# Patient Record
Sex: Male | Born: 1970 | Race: Black or African American | Hispanic: No | Marital: Single
Health system: Southern US, Community
[De-identification: ages and names within clinical notes are randomized; demographics above are authoritative.]

---

## 2006-10-29 ENCOUNTER — Emergency Department (HOSPITAL_COMMUNITY): Admission: EM | Admit: 2006-10-29 | Discharge: 2006-10-29 | Payer: Self-pay | Admitting: Emergency Medicine

## 2007-05-31 ENCOUNTER — Emergency Department: Payer: Self-pay | Admitting: Emergency Medicine

## 2007-05-31 ENCOUNTER — Other Ambulatory Visit: Payer: Self-pay

## 2010-09-28 ENCOUNTER — Emergency Department: Payer: Self-pay | Admitting: Emergency Medicine

## 2011-02-18 ENCOUNTER — Ambulatory Visit: Payer: Self-pay | Admitting: Emergency Medicine

## 2011-03-11 ENCOUNTER — Ambulatory Visit: Payer: Self-pay | Admitting: Emergency Medicine

## 2011-03-15 LAB — PATHOLOGY REPORT

## 2011-03-23 ENCOUNTER — Emergency Department: Payer: Self-pay | Admitting: Emergency Medicine

## 2011-09-16 ENCOUNTER — Emergency Department: Payer: Self-pay | Admitting: Emergency Medicine

## 2011-12-02 ENCOUNTER — Encounter: Payer: Self-pay | Admitting: Internal Medicine

## 2011-12-03 ENCOUNTER — Encounter: Payer: Self-pay | Admitting: Internal Medicine

## 2011-12-09 ENCOUNTER — Ambulatory Visit: Payer: Self-pay | Admitting: Emergency Medicine

## 2011-12-17 ENCOUNTER — Ambulatory Visit: Payer: Self-pay | Admitting: Specialist

## 2011-12-31 ENCOUNTER — Encounter: Payer: Self-pay | Admitting: Internal Medicine

## 2012-01-31 ENCOUNTER — Encounter: Payer: Self-pay | Admitting: Internal Medicine

## 2012-04-05 ENCOUNTER — Inpatient Hospital Stay: Payer: Self-pay | Admitting: Internal Medicine

## 2012-04-05 LAB — COMPREHENSIVE METABOLIC PANEL
Albumin: 4.3 g/dL (ref 3.4–5.0)
BUN: 17 mg/dL (ref 7–18)
Bilirubin,Total: 0.7 mg/dL (ref 0.2–1.0)
Calcium, Total: 9 mg/dL (ref 8.5–10.1)
Chloride: 102 mmol/L (ref 98–107)
Co2: 25 mmol/L (ref 21–32)
Creatinine: 1.56 mg/dL — ABNORMAL HIGH (ref 0.60–1.30)
EGFR (African American): >60
Potassium: 2.7 mmol/L — ABNORMAL LOW (ref 3.5–5.1)
SGOT(AST): 37 U/L (ref 15–37)
Total Protein: 9.1 g/dL — ABNORMAL HIGH (ref 6.4–8.2)

## 2012-04-05 LAB — ACETAMINOPHEN LEVEL: Acetaminophen: <2 ug/mL

## 2012-04-05 LAB — VALPROIC ACID LEVEL
Valproic Acid: 131 ug/mL — ABNORMAL HIGH
Valproic Acid: 142 ug/mL — ABNORMAL HIGH

## 2012-04-05 LAB — DRUG SCREEN, URINE
Amphetamines, Ur Screen: NEGATIVE (ref ?–1000)
Barbiturates, Ur Screen: NEGATIVE (ref ?–200)
Tricyclic, Ur Screen: NEGATIVE (ref ?–1000)

## 2012-04-05 LAB — CBC
HGB: 14.6 g/dL (ref 13.0–18.0)
MCH: 29.1 pg (ref 26.0–34.0)
Platelet: 216 10*3/uL (ref 150–440)
RDW: 14.1 % (ref 11.5–14.5)

## 2012-04-05 LAB — MAGNESIUM: Magnesium: 2.3 mg/dL

## 2012-04-06 ENCOUNTER — Inpatient Hospital Stay: Payer: Self-pay | Admitting: Unknown Physician Specialty

## 2012-04-06 LAB — BASIC METABOLIC PANEL
BUN: 12 mg/dL (ref 7–18)
Chloride: 108 mmol/L — ABNORMAL HIGH (ref 98–107)
Co2: 24 mmol/L (ref 21–32)
Creatinine: 1.25 mg/dL (ref 0.60–1.30)
EGFR (Non-African Amer.): >60

## 2012-04-06 LAB — VALPROIC ACID LEVEL: Valproic Acid: 103 ug/mL — ABNORMAL HIGH

## 2012-04-07 LAB — VALPROIC ACID LEVEL: Valproic Acid: 29 ug/mL — ABNORMAL LOW

## 2012-04-10 LAB — LITHIUM LEVEL: Lithium: 0.32 mmol/L — ABNORMAL LOW

## 2012-04-10 LAB — TSH: Thyroid Stimulating Horm: 0.775 u[IU]/mL

## 2012-04-10 LAB — LIPID PANEL: Cholesterol: 148 mg/dL (ref 0–200)

## 2012-04-17 ENCOUNTER — Emergency Department: Payer: Self-pay | Admitting: Emergency Medicine

## 2012-04-17 LAB — URINALYSIS, COMPLETE
Bilirubin,UR: NEGATIVE
Blood: NEGATIVE
Ketone: NEGATIVE
Leukocyte Esterase: NEGATIVE
Protein: NEGATIVE
Specific Gravity: 1.012 (ref 1.003–1.030)

## 2015-02-23 NOTE — Discharge Summary (Signed)
PATIENT NAME:  Stephen Phillips, Stephen Phillips#:  [id number] DATE OF BIRTH:  07/20/1971  DATE OF ADMISSION:  04/05/2012 DATE OF DISCHARGE:  04/06/2012  PRIMARY CARE PHYSICIAN: Bluford MainSheikh Tejan-Sie, MD   DISPOSITION: The patient is to be discharged to [facility].   DISCHARGE DIAGNOSES:  1. Suicide attempt with overdose on Depakote/valproic acid.  2. Hypertension. 3. Gastroesophageal reflux disease.  4. Hepatitis C. 5. Chronic obstructive pulmonary disease.   DISCHARGE MEDICATIONS: The patient will continue to use his Proventil inhaler as needed. Additionally, he will take omeprazole 20 mg, 1 tab by mouth every morning 30 to 60 minutes prior to eating. The patient's chlorthalidone is currently being held due to his blood pressures at this time. His potassium supplementation is also being held due to him being off the chlorthalidone at this time and his potassium being stable without supplementation. He will continue to be assessed regarding the need for these medications [facility], and they will be reinstituted if needed.   HOSPITAL COURSE: This patient is a 44 year old African American male who presented [facility] the [facility] Emergency Room on the date of admission after having overdosed on his Depakote in a suicide attempt after marital discord. Please see History and Physical examination for full details regarding his initial presentation, triage, evaluation, and treatment. The patient was admitted to the Critical Care Unit for close monitoring. Psychiatry was consulted in the standard fashion, and his serum Depakote levels were followed closely. The patient's metabolic parameters were also followed very closely, as were his vital signs, mental status, and the like. The patient did remain medically stable throughout his entire hospitalization. Psychiatry recommended Inpatient Behavioral Health care  following his medical clearance. The patient did have a valproic acid level that came back higher than 200, and at that time the decision was made to give the patient one dose of activated charcoal. His valproic acid levels subsequently trended downward nicely. On the following day, the patient continued to be without any medical complications whatsoever. As aforementioned, his valproic acid levels were trending downward satisfactorily. Internal Medicine deemed the patient stable for discharge to the Inpatient Behavioral Health Unit and signed off of the patient's case.   The patient will be transferred to the Inpatient Behavioral Health Unit [facility] for further psychiatric care. [facility], the patient is to follow up with his primary care provider, Dr. Ellsworth Lennoxejan-Sie, within 1 to 2 weeks of discharge, sooner if needed.   Overall, this patient's hospitalization remained uncomplicated, and as aforementioned he was transferred to Inpatient Behavioral Health in a satisfactory condition. Behavioral Health will contact Internal Medicine again should the need arise for Internal Medicine services.   DIET: Prudent diet.   ACTIVITY: As tolerated.   FOLLOWUP: Follow-up visit with Dr. Ellsworth Lennoxejan-Sie within 1 to 2 weeks of discharge from the Inpatient Behavioral Health Unit, sooner if needed.   TIME SPENT:   Time spent, including counseling, coordination of patient care, and the like was greater than 30 minutes.    ____________________________ Burnett HarryMartin G. Shaune SpittleMayer, PA-C, MSPAS, dictating on behalf of Dr. Ellsworth Lennoxejan-Sie. mgm:cbb D: 04/06/2012 13:55:04 ET T: 04/06/2012 17:24:36 ET JOB#: [id number]  cc: Burnett HarryMartin G. Stacie AcresMayer, PA, <Dictator> Sheikh A. Ellsworth Lennoxejan-Sie, MD Burnett HarryMARTIN G Conley Pawling PA ELECTRONICALLY SIGNED 04/10/2012 12:57 Charlesetta GaribaldiSHEIKH A TEJAN-SIE MD ELECTRONICALLY SIGNED 05/01/2012 13:32

## 2015-02-23 NOTE — Op Note (Signed)
PATIENT NAME:  Stephen Phillips, Stephen Phillips MR#:  [id number] DATE OF BIRTH:  15-Dec-1970  DATE OF PROCEDURE:  12/17/2011  PREOPERATIVE DIAGNOSIS: Bilateral carpal tunnel syndrome.   POSTOPERATIVE DIAGNOSIS: Bilateral carpal tunnel syndrome.   PROCEDURES:  1. Right carpal tunnel release.  2. Left carpal tunnel release.   SURGEON: Clare Gandyhristopher E. Lindie Roberson, MD   ANESTHESIA: General.   COMPLICATIONS: None.   TOURNIQUET TIME: Approximately 15 minutes on each side.   PROCEDURE: After adequate induction of general anesthesia, both upper extremities were thoroughly prepped with alcohol and ChloraPrep and draped in standard sterile fashion. Identical procedures are performed on each side. The extremity is wrapped out with the Esmarch bandage and elevated and the pneumatic tourniquet is elevated to 250 mmHg. Under loupe magnification, standard volar carpal tunnel incision is made and the dissection is carried down to the transverse retinacular ligament. The ligament is incised in the midportion. The distal release is performed with the small scissors. The proximal release is performed with the small scissors and the carpal tunnel scissors. The median nerve on each side is moderately compressed. There is mild synovitis present but no mass lesion. Careful check is made both proximally and distally to ensure that complete release had been obtained. The wound is thoroughly irrigated multiple times. Skin edges are infiltrated with 0.5% plain Marcaine. Skin is closed with 4-0 nylon. Soft bulky dressing is applied. Tourniquet is released. The patient is returned to the recovery room in satisfactory condition having tolerated the procedure quite well.   ____________________________ Clare Gandyhristopher E. Parthenia Tellefsen, MD ces:drc D: 12/17/2011 09:37:23 ET T: 12/17/2011 11:29:42 ET JOB#: [id number] Clare GandyHRISTOPHER E Yisel Megill MD ELECTRONICALLY SIGNED 12/17/2011 16:35

## 2015-02-23 NOTE — Consult Note (Signed)
PATIENT NAME:  Stephen Phillips, Stephen Phillips MR#:  [id number] DATE OF BIRTH:  1971-09-17  DATE OF CONSULTATION:  04/05/2012  REFERRING PHYSICIAN:  Marlaine Hind, MD  CONSULTING PHYSICIAN:  Venida Jarvis, MD   REASON FOR CONSULTATION: Overdose.  CHIEF COMPLAINT: "I had trouble with my wife".   HISTORY OF PRESENT ILLNESS: The patient was admitted to the ICU last night following a significant overdose of Depakote with Depakote level of 130 which was probably early in the Depakote treatment. He reports that he had been using cocaine. He came home and his wife got mad at him and wanted to kick him out of the house. He became then acutely suicidal and overdosed on several handfuls of Depakote in a suicide attempt. He said at the time he wanted to kill himself.   The patient reports to me a history of bipolar disorder, mostly depressed for the last 2 to 3 months. Symptoms of depression have included decreased sleep with difficulty falling asleep and sleeping 4 to 5 hours per night, increased guilt, decreased energy, decreased concentration, variable appetite, diurnal variation, feeling worse in the evening, irritability, helpless hopeless feelings, and decreased pleasure. No crying spells. No change in interest. He has had suicidal thoughts over the last 2 or 3 months. In 2001 he thought seriously about attempting suicide by running in front of a truck.   The patient also has manic spells. He reports they last up to a week or two and symptoms during the manic spell are euphoria, distractibility, racing thoughts, some hyperactivity, slight increase in talking, and spending too much money. No grandiosity. He reports a 1 to 2  high spell about 5 or 6 days ago.   The patient has a history of crack cocaine use and abuse. He generally binges crack at about 400 dollars at a time perhaps every two weeks and this is why his wife became angry with him.   The patient is followed by [facility] and has been on  Depakote 500 b.i.d. for some time. Blood levels are not known. Other medications also are not known at this time.   FAMILY HISTORY: Brother is seeing a psychiatrist, diagnosis not known.   PAST PSYCHIATRIC HISTORY: Previous hospitalization at [facility], none in recent years.   PAST MEDICAL HISTORY:  1. Gastroesophageal reflux disease. 2. Hepatitis C from getting a tattoo 20 years ago. 3. Hypertension.   4. Cholecystectomy. 5. Ventral hernia repair.   MEDICATIONS ON ADMISSION:  1. Depakote 500 b.i.d.  2. Cyproheptadine 4 b.i.d. which he takes for urticaria.   3. Omeprazole 20 daily. 4. Chlorthalidone half tablet daily.   ALLERGIES: No other drug allergies known at this time.   SOCIAL HISTORY: The patient has a GED. Currently does live with the wife of two years. First marriage. Has a stepdaughter, 8, who he serves as the father for. Odd jobs include mowing lawns, etc. He smokes just occasionally. Does not use alcohol or other drugs. When well, he enjoys watching TV and reading.   MENTAL STATUS EXAMINATION: Currently drowsy black male who I have to wake up to give a history, although when waking him up he does at least give a fair history and I thought was at least moderately reliable. He was fairly aware of his surroundings except for dropping off and could pay attention except when he fell back asleep but did respond when I woke him up. He reported some auditory hallucinations occasionally but distant sounds of other people here from  outside his voice and some paranoia in that he feels people are talking about him. Not sure how much of this is related to cocaine or not. Currently appeared to be depressed but it was hard to tell because he was sleepy. He did have psychomotor retardation. There was no pressured speech or flight of ideas. He was oriented x4, knew the presidents backwards x2. He could not remember any objects after one and three minutes. Again, he was drowsy.   DIAGNOSES:   AXIS I:  1. Bipolar disorder, probably I, depressed. 2. Cocaine dependence.   AXIS III:  1. Depakote overdose.  2. Hypertension. 3. Gastroesophageal reflux disease.   4. Hepatitis C.  5. History of urticaria.   AXIS IV: Conflict with wife.   AXIS V: 20. The patient is depressed with suicide attempt.   LABORATORY, DIAGNOSTIC, AND RADIOLOGICAL DATA: EKG showed normal sinus rhythm, nonspecific T wave abnormality. Glucose, BUN, creatinine, electrolytes within normal limits except for low potassium which has been replaced. Alcohol blood level is negative. TSH within normal limits. Valproic acid blood level was 131 earlier on admission. CBC within normal limits. Drug screen is positive for cocaine. Magnesium was 2.3.   ASSESSMENT AND PLAN: The patient needs to be transferred from the ICU when medically cleared. We do need to watch the drowsiness if he still has somewhat of an altered mental status during the course of his ICU stay we probably need to get a free Depakote level also.   ____________________________ Venida JarvisWilliam James Ryan II, MD wjr:drc D: 04/05/2012 12:10:46 ET T: 04/05/2012 12:59:59 ET JOB#: [id number]  cc: Venida JarvisWilliam James Ryan II, MD, <Dictator> Jules HusbandsWILLIAM J RYAN MD ELECTRONICALLY SIGNED 04/07/2012 14:17

## 2015-02-23 NOTE — H&P (Signed)
PATIENT NAME:  Stephen Phillips, Stephen Phillips Phillips MR#:  [id number] DATE OF BIRTH:  November 04, 1970  DATE OF ADMISSION:  04/05/2012  PRIMARY CARE PHYSICIAN: Dr. Ellsworth Lennox   CHIEF COMPLAINT: Drug overdose.   HISTORY OF PRESENT ILLNESS: Stephen Phillips Stephen Phillips Phillips is a 44 year old African American male with history of depression who was brought to the Emergency Department for evaluation of drug overdose. He took the whole bottle of Depakote, stating it is about 60 tablets, each about 500 mg. The patient claimed that he did that around midnight, between 12:15 and 12:30. He stated that the reason he wanted to kill himself was that his wife left him. She has changed the locks on the doors. Right now the only complaint that he has is feeling sleepy.     REVIEW OF SYSTEMS: CONSTITUTIONAL: Denies any fever. No chills, but he has some fatigue.  EYES: No blurring of vision. No double vision. ENT: No hearing impairment. No sore throat. No dysphagia. CARDIOVASCULAR: No chest pain. No shortness of breath. No palpitations. No syncope.  RESPIRATORY: No cough. No shortness of breath. No chest pain.  GASTROINTESTINAL: No abdominal pain. No vomiting, no diarrhea. GENITOURINARY: No dysuria. No frequency of urination. MUSCULOSKELETAL: No joint pain or swelling. No muscular pain or swelling. INTEGUMENT: No skin rash. No ulcers. NEUROLOGY: No focal weakness. No seizure activity. No headache. PSYCH: No anxiety but he has depression.  ENDOCRINE: No polyuria or polydipsia. No heat or cold intolerance.   PAST MEDICAL HISTORY:  1. Depression.  2. Gastroesophageal reflux disease.  3. Hepatitis C.   PAST SURGICAL HISTORY:  1. Cholecystectomy and in February of this year.  2. Ventral hernia repair for incarcerated hernia.   SOCIAL HABITS: He denies smoking, stating that he smokes only occasionally. No history of alcohol abuse.   FAMILY HISTORY: Both parents had diabetes mellitus.   SOCIAL HISTORY: He is married. 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   ADMISSION MEDICATIONS:  1. Cyproheptadine 4 mg, taking twice a day.  2. Omeprazole 20 mg once a day.  3. Chlorthalidone taking 1/2 tablet a day.   ALLERGIES: No known drug allergies.   PHYSICAL EXAMINATION:  VITAL SIGNS: Blood pressure 138/78, respiratory rate 16, pulse 67, temperature 96.9, oxygen saturation 98.   GENERAL APPEARANCE: Young male lying in bed in no acute distress. He looks sleepy.   HEAD: No pallor. No icterus. No cyanosis.   EARS, NOSE, AND THROAT: Hearing was normal. Nasal mucosa, lips, and tongue were normal.   EYES: Normal eyelids. Conjunctivae are red or congested. Pupils are about 6 mm, sluggishly reactive to light.   NECK: Supple. Trachea at midline. No thyromegaly. No cervical lymphadenopathy. No masses.   HEART: Normal S1, S2. No S3, S4. No murmur or gallop. No carotid bruits.   RESPIRATORY: Normal breathing pattern without use of accessory muscles. No rales. No wheezing.   ABDOMEN: Soft without tenderness. No hepatosplenomegaly. No masses. No hernias.   SKIN: No ulcers. No subcutaneous nodules.   MUSCULOSKELETAL: No joint swelling. No clubbing.   NEUROLOGIC: Cranial nerves II through XII are intact. No focal motor deficit.   PSYCHIATRIC: The patient is alert but slightly sleepy, oriented to place and people. Mood and affect are flat.   LABORATORY, DIAGNOSTIC, AND RADIOLOGICAL DATA: EKG showed normal sinus rhythm at rate of 64 per minute. Nonspecific T wave abnormalities. Serum glucose 100, BUN 17, creatinine 1.5, sodium 139, potassium 2.7. Alcohol level less than 3, total protein 9.1, albumin 4.3. Normal liver transaminases.  TSH 1.8. Valproic acid level was 131. CBC showed white count 10,000, hemoglobin 14, hematocrit 42, platelet count 216. Acetaminophen level less than 2, salicylate 3.8.   ASSESSMENT:  1. Drug overdose with Depakote or valproic acid. 2. Suicide attempt.  3. Hypokalemia.  4. Depression.  5. Gastroesophageal reflux disease  by history.  6. Hepatitis C.  7. History of cholecystectomy.  8. Ventral hernia repair.   PLAN: The patient will be admitted to the Intensive Care Unit. [facility] was called and they recommended monitoring and to repeat the Depakote level in a few hours. Another level of valproic acid is ordered and timed to be checked in a few hours. We will monitor for any arrhythmias or any prolongation of the QT intervals. Psychiatric consultation. We will have one-on-one sitter. IV hydration and potassium replacement.   TIME SPENT: Time Spent evaluating this patient took more than 55 minutes.    ____________________________ Carney CornersAmir M. Rudene Rearwish, MD amd:bjt D: 04/05/2012 06:27:30 ET T: 04/05/2012 08:33:54 ET JOB#: [id number]  cc: Sheikh A. Ellsworth Lennoxejan-Sie, MD Zollie ScaleAMIR M Gissella Niblack MD ELECTRONICALLY SIGNED 04/06/2012 6:19

## 2015-02-23 NOTE — Discharge Summary (Signed)
PATIENT NAME:  Stephen Phillips, Stephen Phillips MR#:  [id number] DATE OF BIRTH:  1971-04-05  DATE OF ADMISSION:  04/06/2012 DATE OF DISCHARGE:  04/10/2012  HISTORY OF PRESENT ILLNESS: The patient was admitted with bipolar disorder depressed and cocaine dependence. For further details, please see typed attached History and Physical.  ACCESSORY CLINICAL DATA: On psychiatry valproic acid blood level was 29 on 06/07. Lithium was 0.32 on 06/10.  This was while on six 300 mg of lithium twice a day, also on Hygroton at the same time. Lipid profile showed cholesterol 148, triglycerides 117, HDL 46, VLDL 23, and LDL 79. TSH was 0.775.   HOSPITAL COURSE: We decided to make the medication change because the patient had been somewhat volatile in his mood recently.  He was therefore switched to lithium and Zyprexa. Even though the lithium blood level was low, I elected to keep at six 300 mg twice a day for present because he had been on the Hygroton, which is a diuretic for blood pressure, and this was discussed with the patient. Zyprexa was started at 5 mg at bedtime and increased to 10. Also the patient did have some trouble sleeping and he was given Restoril p.Phillips.n. for sleep. Other medication remained as is. At the time of discharge he was euthymic and reports feeling better than he had in quite some time. He had no suicidal thinking.   DIAGNOSES:  AXIS I:  1. Bipolar disorder, probably I depressed.  2. Cocaine dependence.   AXIS III:  1. Depakote overdose.  2. Hypertension.  3. Gastroesophageal reflux disease.  4. Hepatitis C.  5. History of urticaria.   CONDITION ON DISCHARGE: Good.   DISPOSITION: The [facility].   MEDICATIONS:  1. Hygroton 12.5 mg daily.  2. Periactin 4 mg every 12 hours.  3. Lithium 300 b.i.d.  4. Zyprexa 10 mg at bedtime. 5. Prilosec 20 mg daily.  6. Restoril 15 mg at bedtime p.Phillips.n. sleep. 7. Zanaflex 4 mg every six hours p.Phillips.n.   DIET:  Slight amount of salt added.   ACTIVITY: As tolerated.    ____________________________ Venida JarvisWilliam James Ryan II, MD wjr:bjt D: 04/10/2012 14:40:40 ET T: 04/10/2012 15:04:24 ET JOB#: [id number]  cc: Venida JarvisWilliam James Ryan II, MD, <Dictator> Jules HusbandsWILLIAM J RYAN MD ELECTRONICALLY SIGNED 04/10/2012 15:50

## 2015-02-23 NOTE — Op Note (Signed)
PATIENT NAME:  Stephen Phillips, Galen R MR#:  [id number] DATE OF BIRTH:  [birth year]  DATE OF PROCEDURE:  12/09/2011  PREOPERATIVE DIAGNOSIS: Incarcerated ventral hernia.   POSTOPERATIVE DIAGNOSIS: Incarcerated ventral hernia.   PROCEDURE: Repair of incarcerated ventral hernia.   SURGEON: Jovita GammaMasud Smrithi Pigford, MD  INDICATION: This patient was seen by me in my office because of the hernia which was in the midline area and he was having a lot of pain with it and it was incarcerated and could not be reduced.  DESCRIPTION OF PROCEDURE: The patient was then brought to surgery. Under general anesthesia, the abdomen was prepped and draped. A small incision was made on top of this hernia. After cutting skin and subcutaneous tissue, the hernia sac was noted. It was coming through a defect in the midline. It was a little difficult to reduce. I had to open up the fascia a little bit to get the thing in. It was still too small an opening so I put three stitches of 0 Surgilon sutures and checked the area around it. There was no other fat and no other hernia was noticed. The subcutaneous tissue was closed with 3-0 Vicryl. Marcaine was injected. The skin was closed with staples. The patient tolerated the procedure well and was sent to the Recovery Room in satisfactory condition.  ____________________________ Alton RevereMasud S. Cecelia ByarsHashmi, MD msh:slb D: 12/09/2011 10:49:03 ET T: 12/09/2011 11:01:12 ET JOB#: [id number]  cc: Trecia Maring S. Cecelia ByarsHashmi, MD, <Dictator> Silas FloodSheikh A. Ellsworth Lennoxejan-Sie, MD Meryle ReadyMASUD S Cyncere Ruhe MD ELECTRONICALLY SIGNED 12/09/2011 12:32

## 2021-11-20 ENCOUNTER — Emergency Department: Payer: Self-pay

## 2021-11-20 ENCOUNTER — Other Ambulatory Visit: Payer: Self-pay

## 2021-11-20 ENCOUNTER — Emergency Department
Admission: EM | Admit: 2021-11-20 | Discharge: 2021-11-20 | Disposition: A | Discharge status: Home / Self Care | Payer: Self-pay | Attending: Emergency Medicine | Admitting: Emergency Medicine

## 2021-11-20 DIAGNOSIS — S0181XA Laceration without foreign body of other part of head, initial encounter: Secondary | ICD-10-CM | POA: Insufficient documentation

## 2021-11-20 DIAGNOSIS — S40022A Contusion of left upper arm, initial encounter: Secondary | ICD-10-CM | POA: Insufficient documentation

## 2021-11-20 DIAGNOSIS — S0990XA Unspecified injury of head, initial encounter: Secondary | ICD-10-CM | POA: Insufficient documentation

## 2021-11-20 DIAGNOSIS — Z23 Encounter for immunization: Secondary | ICD-10-CM | POA: Insufficient documentation

## 2021-11-20 DIAGNOSIS — S022XXA Fracture of nasal bones, initial encounter for closed fracture: Secondary | ICD-10-CM | POA: Insufficient documentation

## 2021-11-20 DIAGNOSIS — S199XXA Unspecified injury of neck, initial encounter: Secondary | ICD-10-CM | POA: Insufficient documentation

## 2021-11-20 DIAGNOSIS — T07XXXA Unspecified multiple injuries, initial encounter: Secondary | ICD-10-CM

## 2021-11-20 MED ORDER — TETANUS-DIPHTH-ACELL PERTUSSIS 5-2.5-18.5 LF-MCG/0.5 IM SUSY
0.5000 mL | PREFILLED_SYRINGE | Freq: Once | INTRAMUSCULAR | Status: AC
  Administered 2021-11-20: 0.5 mL via INTRAMUSCULAR
  Filled 2021-11-20: qty 0.5

## 2021-11-20 MED ORDER — LIDOCAINE-EPINEPHRINE-TETRACAINE (LET) TOPICAL GEL
3.0000 mL | Freq: Once | TOPICAL | Status: AC
  Administered 2021-11-20: 3 mL via TOPICAL
  Filled 2021-11-20: qty 3

## 2021-11-20 MED ORDER — LIDOCAINE HCL (PF) 1 % IJ SOLN
5.0000 mL | Freq: Once | INTRAMUSCULAR | Status: AC
  Administered 2021-11-20: 5 mL via INTRADERMAL

## 2021-11-20 MED ORDER — ONDANSETRON HCL 4 MG/2ML IJ SOLN
4.0000 mg | Freq: Once | INTRAMUSCULAR | Status: AC
  Administered 2021-11-20: 4 mg via INTRAVENOUS
  Filled 2021-11-20: qty 2

## 2021-11-20 MED ORDER — BACITRACIN-NEOMYCIN-POLYMYXIN 400-5-5000 EX OINT
TOPICAL_OINTMENT | Freq: Once | CUTANEOUS | Status: AC
  Administered 2021-11-20: 1 via TOPICAL
  Filled 2021-11-20: qty 1

## 2021-11-20 MED ORDER — MORPHINE SULFATE (PF) 4 MG/ML IV SOLN
4.0000 mg | Freq: Once | INTRAVENOUS | Status: AC
  Administered 2021-11-20: 4 mg via INTRAVENOUS
  Filled 2021-11-20: qty 1
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
## 2021-11-20 NOTE — Progress Notes (Signed)
°   11/20/21 0450  Clinical Encounter Type  Visited With Patient  Visit Type Initial;Social support   Chaplain Burris engaged Pt to offer hospitality. Provided warm blanket and let him know that pastoral support was available.
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
## 2021-11-20 NOTE — Discharge Instructions (Addendum)
Follow-up with your regular doctor as needed.  Return the emergency department, urgent care, your regular doctor, or remove the sutures herself in 1 week. Return if your injuries are worsening Keep the wound is clean and dry as possible.  You may shower but be very careful around the area
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
## 2021-11-20 NOTE — ED Notes (Signed)
L posterior thigh: Wound cleaned with betadine and irrigated with saline. Closed with steri strips and benzoin. ABT ointment applied to abrasions. Sterile dry gauze, abd pad, coban and ACE wrap applied.

## 2021-11-20 NOTE — ED Notes (Signed)
Cleaned blood off face and head. Pt has laceration to L forehead and areas of bruising to head and forehead.
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
## 2021-11-20 NOTE — ED Notes (Signed)
Pt sleeping/ resting, arousable to voice, NAD, calm, interactive, no active bleeding. LET applied.
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
## 2024-04-02 ENCOUNTER — Other Ambulatory Visit: Payer: Self-pay

## 2024-04-02 ENCOUNTER — Emergency Department
Admission: EM | Admit: 2024-04-02 | Discharge: 2024-04-02 | Disposition: A | Discharge status: Home / Self Care | Payer: Self-pay | Attending: Emergency Medicine | Admitting: Emergency Medicine

## 2024-04-02 DIAGNOSIS — L02412 Cutaneous abscess of left axilla: Secondary | ICD-10-CM | POA: Insufficient documentation

## 2024-04-02 MED ORDER — SULFAMETHOXAZOLE-TRIMETHOPRIM 800-160 MG PO TABS: 1.0000 | ORAL_TABLET | Freq: Two times a day (BID) | ORAL | 0 refills | Status: AC

## 2024-04-02 MED ORDER — SULFAMETHOXAZOLE-TRIMETHOPRIM 800-160 MG PO TABS
1.0000 | ORAL_TABLET | Freq: Once | ORAL | Status: AC
  Administered 2024-04-02: 1 via ORAL
  Filled 2024-04-02: qty 1

## 2024-04-02 MED ORDER — LIDOCAINE-EPINEPHRINE (PF) 2 %-1:200000 IJ SOLN
10.0000 mL | Freq: Once | INTRAMUSCULAR | Status: AC
  Administered 2024-04-02: 10 mL
  Filled 2024-04-02: qty 20

## 2024-04-02 NOTE — ED Notes (Signed)
 Pt came out of room stating he was leaving and wasn't waiting for any discharge paperwork or prescriptions. This RN was able to get pt to stay until he took a dose of oral antibiotics the provider had ordered for him. Provider notified about pt's decision.

## 2024-04-02 NOTE — ED Provider Notes (Signed)
 [facility] Provider Note    Event Date/Time   First MD Initiated Contact with Patient 04/02/24 1836     (approximate)   History   Abscess   HPI Stephen Phillips is a 53 y.o. male presenting to the emergency department with suspected abscess to his left underarm for about 1 week.  He states this started a week and a half ago and it has been getting painful and enlarging.  He also reports a second abscess in the affected area.  Denies erythema, fever, edema, chest pain.  Patient states he has had roughly 3 of these abscesses in the past month but has not seen anyone to get them drained or received antibiotics.  No pertinent past medical history.  No allergies.     Physical Exam   Triage Vital Signs: ED Triage Vitals  Encounter Vitals Group     BP 04/02/24 1820 (!) 141/92     Systolic BP Percentile --      Diastolic BP Percentile --      Pulse Rate 04/02/24 1819 60     Resp 04/02/24 1819 18     Temp 04/02/24 1819 98.3 F (36.8 C)     Temp Source 04/02/24 1819 Oral     SpO2 --      Weight --      Height --      Head Circumference --      Peak Flow --      Pain Score 04/02/24 1819 8     Pain Loc --      Pain Education --      Exclude from Growth Chart --     Most recent vital signs: Vitals:   04/02/24 1819 04/02/24 1820  BP:  (!) 141/92  Pulse: 60   Resp: 18   Temp: 98.3 F (36.8 C)     General: Awake, no distress.  CV:  Good peripheral perfusion.  Resp:  Normal effort.  Abd:  No distention.  Other:  Roughly 4 cm abscess with underlying nodules and 0.5 cm abscess noted in the left axilla.  No erythema, edema, puslike drainage to the affected area.    ED Results / Procedures / Treatments   Labs (all labs ordered are listed, but only abnormal results are displayed) Labs Reviewed - No data to display   EKG    RADIOLOGY   PROCEDURES:  Critical Care performed: No  .Incision and Drainage  Date/Time: 04/02/2024 8:02  PM  Performed by: Thomasenia Flesher, PA-C Authorized by: Thomasenia Flesher, PA-C   Consent:    Consent obtained:  Verbal   Consent given by:  Patient   Risks, benefits, and alternatives were discussed: yes     Risks discussed:  Damage to other organs, bleeding, incomplete drainage, infection and pain Universal protocol:    Procedure explained and questions answered to patient or proxy's satisfaction: yes     Immediately prior to procedure, a time out was called: yes     Patient identity confirmed:  Verbally with patient Location:    Type:  Abscess   Size:  4x3 cm   Location: left axilla. Pre-procedure details:    Skin preparation:  Povidone-iodine Sedation:    Sedation type:  None Anesthesia:    Anesthesia method:  Local infiltration Procedure type:    Complexity:  Complex Procedure details:    Ultrasound guidance: no     Needle aspiration: no     Incision types:  Single straight  Incision depth:  Dermal   Wound management:  Probed and deloculated   Drainage:  Bloody and purulent   Drainage amount:  Copious   Wound treatment:  Wound left open   Packing materials:  1/4 in iodoform gauze   Amount 1/4" iodoform:  7 cm Post-procedure details:    Procedure completion:  Tolerated well, no immediate complications    MEDICATIONS ORDERED IN ED: Medications  lidocaine -EPINEPHrine  (XYLOCAINE  W/EPI) 2 %-1:200000 (PF) injection 10 mL (10 mLs Infiltration Given by Other 04/02/24 1954)  sulfamethoxazole-trimethoprim (BACTRIM DS) 800-160 MG per tablet 1 tablet (1 tablet Oral Given 04/02/24 2000)     IMPRESSION / MDM / ASSESSMENT AND PLAN / ED COURSE  I reviewed the triage vital signs and the nursing notes.                              Differential diagnosis includes, but is not limited to, abscess, cyst, lipoma, hidradenitis suppurativa, cellulitis  Patient's presentation is most consistent with acute, uncomplicated illness.  Patient presented today for abscess drainage of left  axilla.  Incision and drainage was performed in the affected area for 4 x 3 cm abscess with packing, please see procedure note for full details.  1 dose of Bactrim 800-160 mg tablet given in emergency department.  Patient reported concerns regarding affording the rest of the oral antibiotic, even with the $4 GoodRx coupon.  I sent it to [facility] in [place], which seems to be the cheapest location just in case he does decide to pick it up.  Discussed with nurse regarding transportation, as he does not have any money for ride home at this time.  She was able to give him a taxi voucher so he could get a ride home.  Patient was discharged prior to receiving paperwork with wound care instructions (as he stated he "did not want them") and follow-up regarding wound check in 24 to 48 hours.  Emergency department return precautions were discussed with the patient.  Patient is in agreement to the treatment plan.  Patient is stable for discharge.    FINAL CLINICAL IMPRESSION(S) / ED DIAGNOSES   Final diagnoses:  Abscess of left axilla     Rx / DC Orders   ED Discharge Orders          Ordered    sulfamethoxazole-trimethoprim (BACTRIM DS) 800-160 MG tablet  2 times daily        04/02/24 2002             Note:  This document was prepared using Dragon voice recognition software and may include unintentional dictation errors.    Thomasenia Flesher, PA-C 04/02/24 2008    Claria Crofts, MD 04/02/24 [phone]

## 2024-04-02 NOTE — ED Notes (Signed)
 Pt declined discharge vitals

## 2024-04-02 NOTE — ED Notes (Signed)
PA at bedside draining abscess

## 2024-04-02 NOTE — ED Notes (Signed)
 See triage notes. Patient has a golf ball sized abscess/lump under his left armpit. Patient stated it came up about a week ago.

## 2024-04-02 NOTE — Discharge Instructions (Addendum)
You have been seen in the Emergency Department (ED) today for an abscess.  This was drained in the ED. ° °Please follow up with your doctor or in the ED in 24-48 hours for recheck of your wound.  Read through the additional discharge instructions included below regarding wound care recommendations.  Keep the wound clean and dry, though you may wash as you would normally.  Change the dressing twice daily. ° °Call your doctor sooner or return to the ED if you develop worsening signs of infection such as: increased redness, increased pain, pus, or fever. ° ° °Abscess °An abscess is an infected area that contains a collection of pus and debris. It can occur in almost any part of the body. An abscess is also known as a furuncle or boil. °CAUSES  °An abscess occurs when tissue gets infected. This can occur from blockage of oil or sweat glands, infection of Cretella follicles, or a minor injury to the skin. As the body tries to fight the infection, pus collects in the area and creates pressure under the skin. This pressure causes pain. People with weakened immune systems have difficulty fighting infections and get certain abscesses more often.  °SYMPTOMS °Usually an abscess develops on the skin and becomes a painful mass that is red, warm, and tender. If the abscess forms under the skin, you may feel a moveable soft area under the skin. Some abscesses break open (rupture) on their own, but most will continue to get worse without care. The infection can spread deeper into the body and eventually into the bloodstream, causing you to feel ill.  °DIAGNOSIS  °Your caregiver will take your medical history and perform a physical exam. A sample of fluid may also be taken from the abscess to determine what is causing your infection. °TREATMENT  °Your caregiver may prescribe antibiotic medicines to fight the infection. However, taking antibiotics alone usually does not cure an abscess. Your caregiver may need to make a small cut  (incision) in the abscess to drain the pus. In some cases, gauze is packed into the abscess to reduce pain and to continue draining the area. °HOME CARE INSTRUCTIONS  °Only take over-the-counter or prescription medicines for pain, discomfort, or fever as directed by your caregiver. °If you were prescribed antibiotics, take them as directed. Finish them even if you start to feel better. °If gauze is used, follow your caregiver's directions for changing the gauze. °To avoid spreading the infection: °Keep your draining abscess covered with a bandage. °Wash your hands well. °Do not share personal care items, towels, or whirlpools with others. °Avoid skin contact with others. °Keep your skin and clothes clean around the abscess. °Keep all follow-up appointments as directed by your caregiver. °SEEK MEDICAL CARE IF:  °You have increased pain, swelling, redness, fluid drainage, or bleeding. °You have muscle aches, chills, or a general ill feeling. °You have a fever. °MAKE SURE YOU:  °Understand these instructions. °Will watch your condition. °Will get help right away if you are not doing well or get worse. °Document Released: 07/28/2005 Document Revised: 04/18/2012 Document Reviewed: 12/31/2011 °[employer]® Patient Information ©2015 [employer]. This information is not intended to replace advice given to you by your health care provider. Make sure you discuss any questions you have with your health care provider. ° °Abscess °Care After °An abscess (also called a boil or furuncle) is an infected area that contains a collection of pus. Signs and symptoms of an abscess include pain, tenderness, redness, or hardness,   or you may feel a moveable soft area under your skin. An abscess can occur anywhere in the body. The infection may spread to surrounding tissues causing cellulitis. A cut (incision) by the surgeon was made over your abscess and the pus was drained out. Gauze may have been packed into the space to provide a drain  that will allow the cavity to heal from the inside outwards. The boil may be painful for 5 to 7 days. Most people with a boil do not have high fevers. Your abscess, if seen early, may not have localized, and may not have been lanced. If not, another appointment may be required for this if it does not get better on its own or with medications. °HOME CARE INSTRUCTIONS  °Only take over-the-counter or prescription medicines for pain, discomfort, or fever as directed by your caregiver. °When you bathe, soak and then remove gauze or iodoform packs at least daily or as directed by your caregiver. You may then wash the wound gently with mild soapy water. Repack with gauze or do as your caregiver directs. °SEEK IMMEDIATE MEDICAL CARE IF:  °You develop increased pain, swelling, redness, drainage, or bleeding in the wound site. °You develop signs of generalized infection including muscle aches, chills, fever, or a general ill feeling. °An oral temperature above 102° F (38.9° C) develops, not controlled by medication. °See your caregiver for a recheck if you develop any of the symptoms described above. If medications (antibiotics) were prescribed, take them as directed. °Document Released: 05/06/2005 Document Revised: 01/10/2012 Document Reviewed: 01/01/2008 °[employer]® Patient Information ©2015 [employer]. This information is not intended to replace advice given to you by your health care provider. Make sure you discuss any questions you have with your health care provider. ° °Cellulitis °Cellulitis is an infection of the skin and the tissue beneath it. The infected area is usually red and tender. Cellulitis occurs most often in the arms and lower legs.  °CAUSES  °Cellulitis is caused by bacteria that enter the skin through cracks or cuts in the skin. The most common types of bacteria that cause cellulitis are staphylococci and streptococci. °SIGNS AND SYMPTOMS  °Redness and warmth. °Swelling. °Tenderness or  pain. °Fever. °DIAGNOSIS  °Your health care provider can usually determine what is wrong based on a physical exam. Blood tests may also be done. °TREATMENT  °Treatment usually involves taking an antibiotic medicine. °HOME CARE INSTRUCTIONS  °Take your antibiotic medicine as directed by your health care provider. Finish the antibiotic even if you start to feel better. °Keep the infected arm or leg elevated to reduce swelling. °Apply a warm cloth to the affected area up to 4 times per day to relieve pain. °Take medicines only as directed by your health care provider. °Keep all follow-up visits as directed by your health care provider. °SEEK MEDICAL CARE IF:  °You notice red streaks coming from the infected area. °Your red area gets larger or turns dark in color. °Your bone or joint underneath the infected area becomes painful after the skin has healed. °Your infection returns in the same area or another area. °You notice a swollen bump in the infected area. °You develop new symptoms. °You have a fever. °SEEK IMMEDIATE MEDICAL CARE IF:  °You feel very sleepy. °You develop vomiting or diarrhea. °You have a general ill feeling (malaise) with muscle aches and pains. °MAKE SURE YOU:  °Understand these instructions. °Will watch your condition. °Will get help right away if you are not doing well or get   worse. °Document Released: 07/28/2005 Document Revised: 03/04/2014 Document Reviewed: 01/03/2012 °[employer]® Patient Information ©2015 [employer]. This information is not intended to replace advice given to you by your health care provider. Make sure you discuss any questions you have with your health care provider. ° ° ° °

## 2024-04-02 NOTE — ED Triage Notes (Signed)
 Pt to ED via POV from home. Pt reports abscess under left underarm x1 wk hat is getting more painful and bigger.

## 2024-12-18 ENCOUNTER — Ambulatory Visit: Payer: Self-pay | Admitting: Gastroenterology
